# Patient Record
Sex: Male | Born: 2000 | Race: Black or African American | Hispanic: No | Marital: Single | Smoking: Never smoker
Health system: Southern US, Community
[De-identification: ages and names within clinical notes are randomized; demographics above are authoritative.]

## PROBLEM LIST (undated history)

## (undated) DIAGNOSIS — J45909 Unspecified asthma, uncomplicated: Secondary | ICD-10-CM

---
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
## 2019-04-02 IMAGING — DX DG CHEST 2V
2 series · 2 of 2 positions shown · non-contrast
Comparison: 10/10/2012

CLINICAL DATA: Cough and chest pain for several hours

EXAM:
CHEST  2 VIEW

[chest pa]
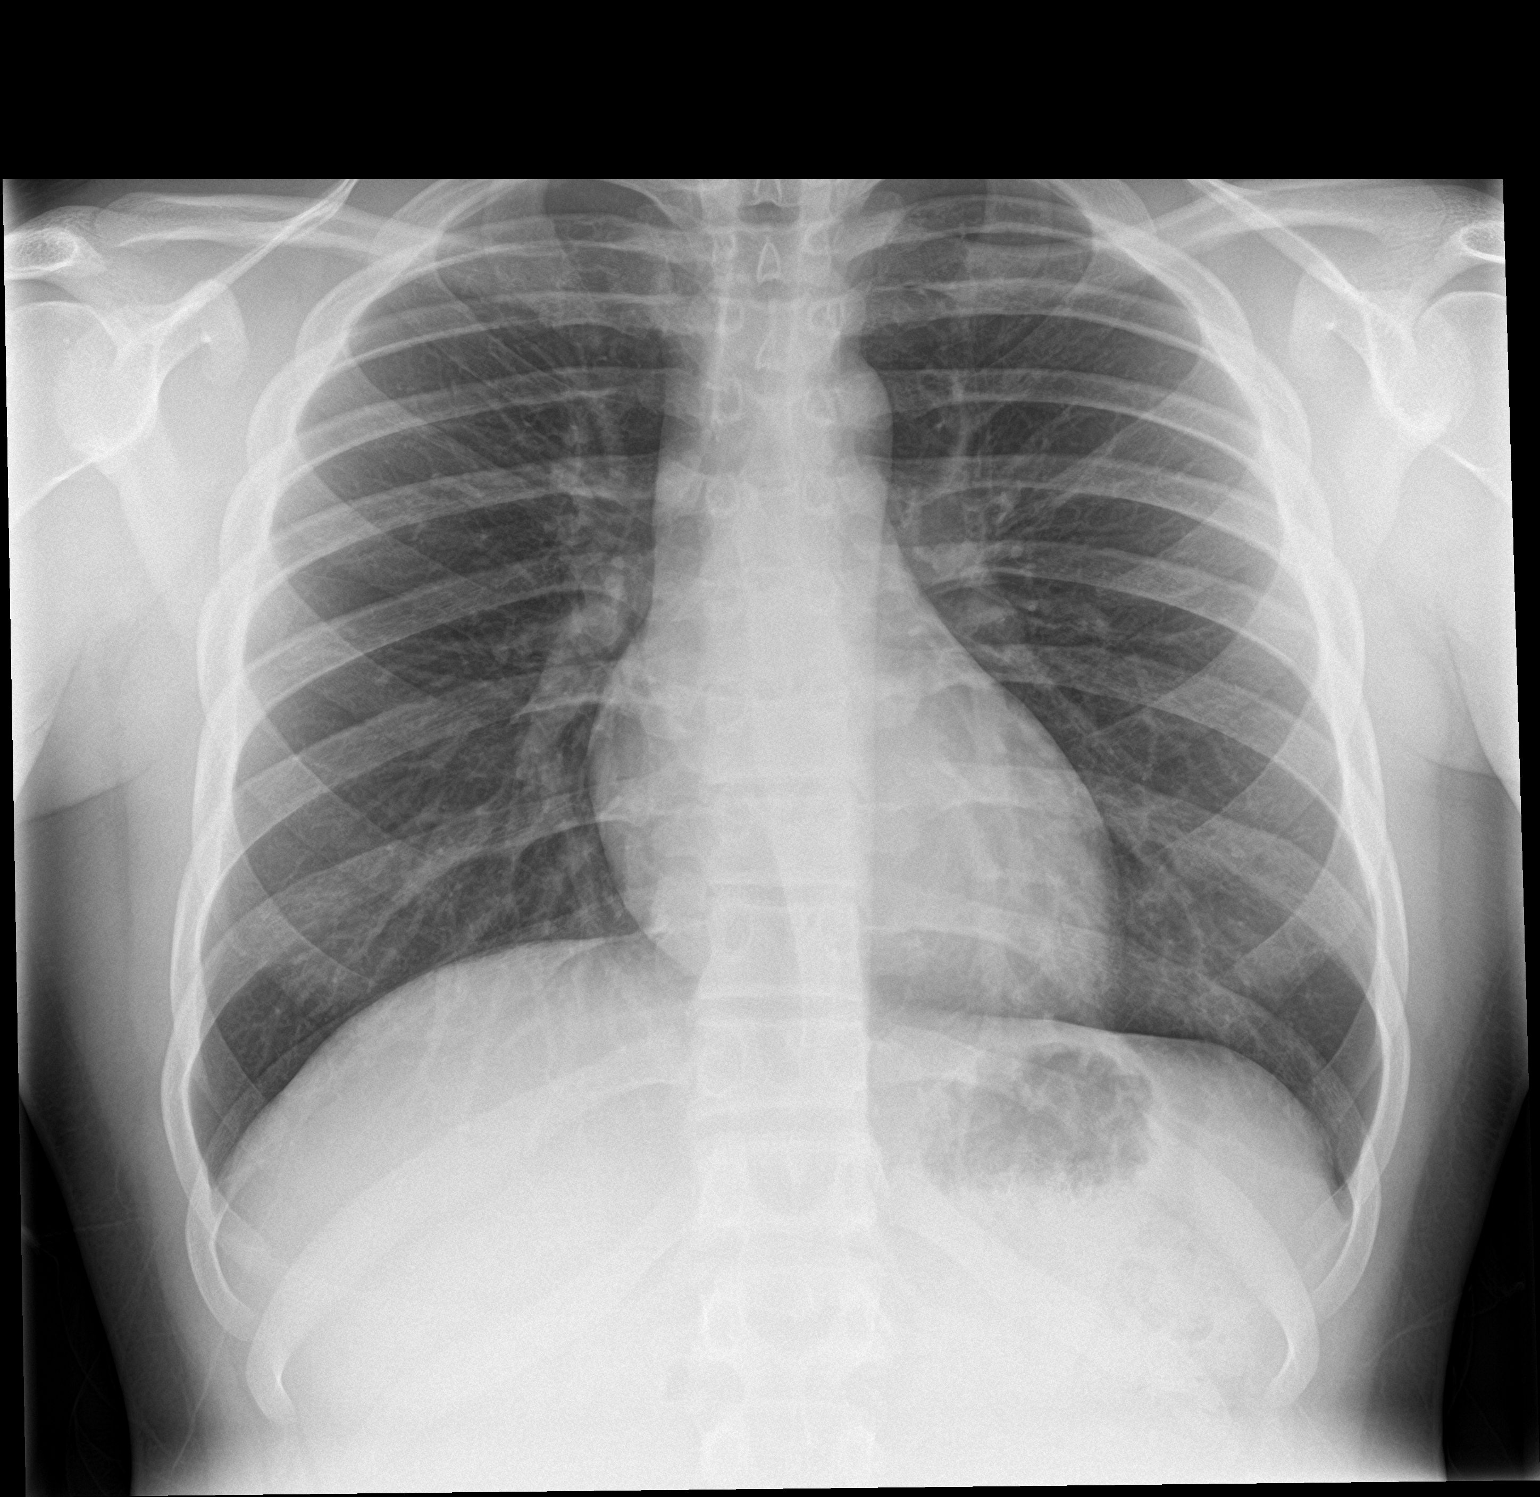

[chest lat]
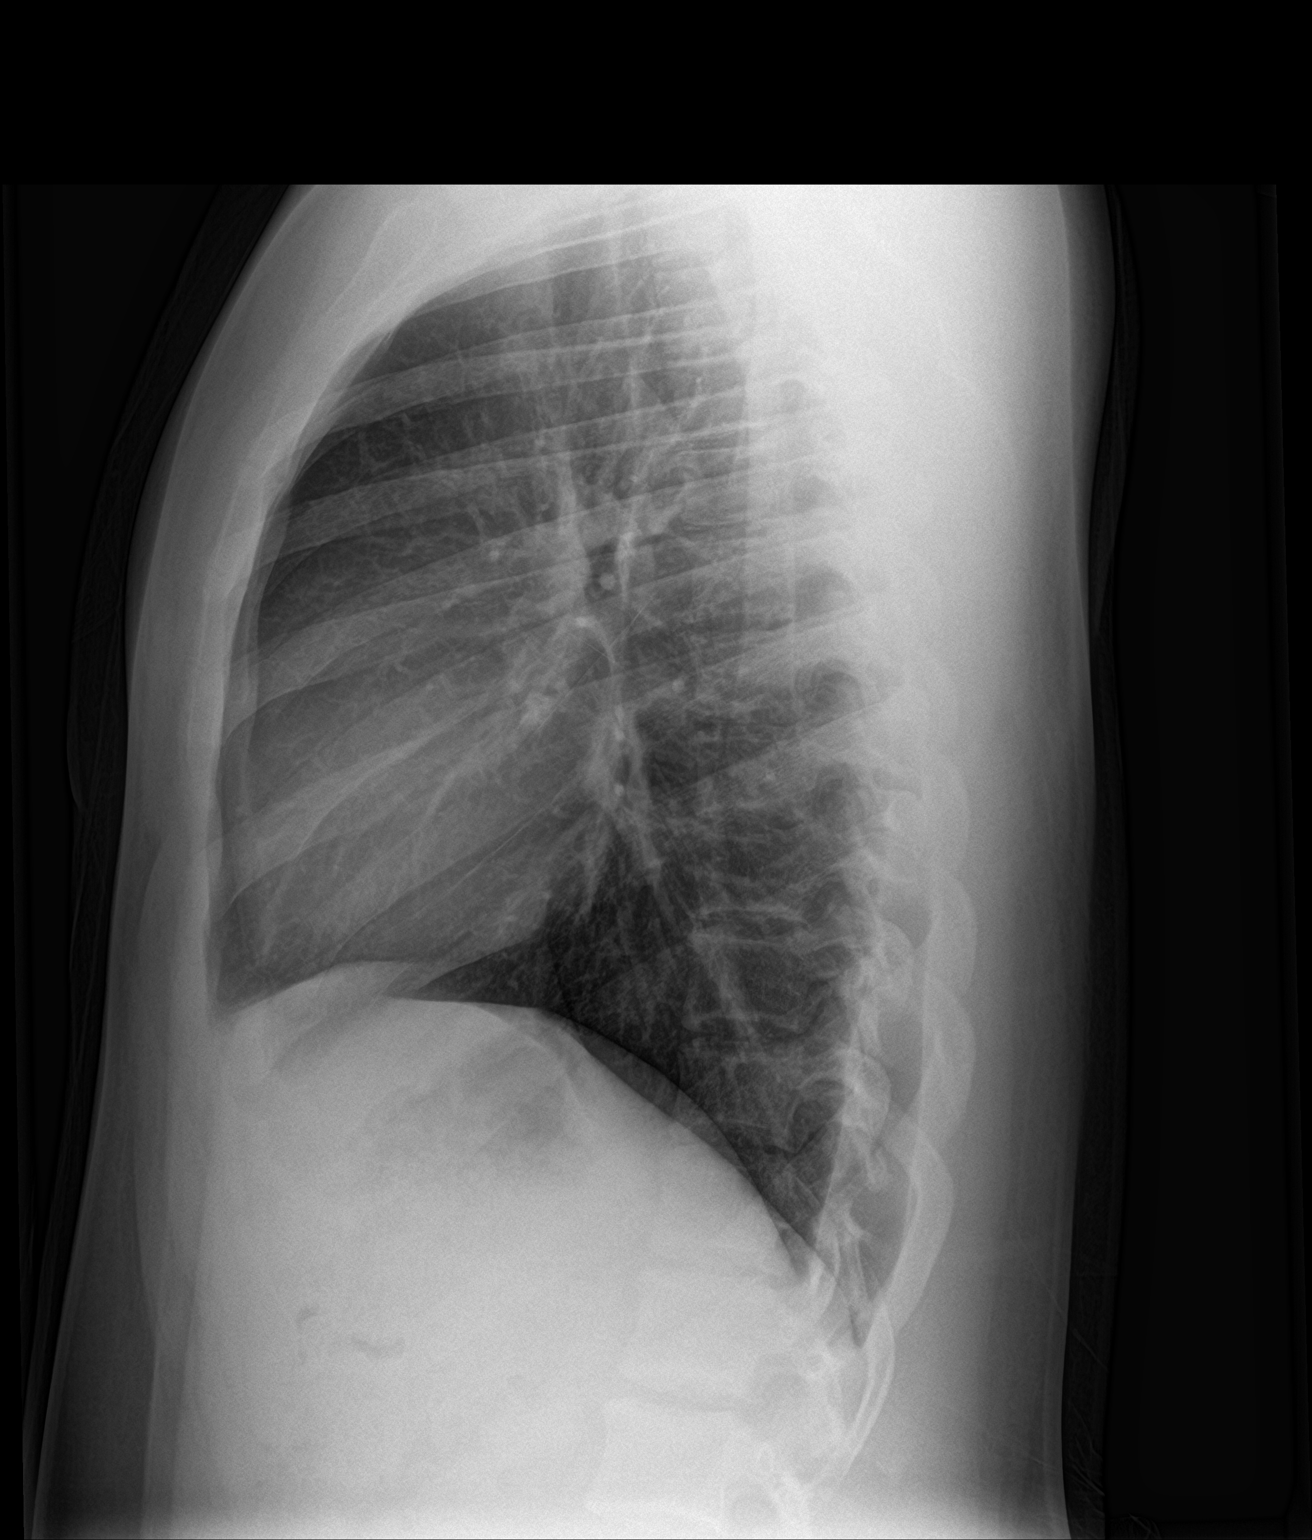

[2 of 2 positions shown; findings below may reference images not displayed]

FINDINGS: The heart size and mediastinal contours are within normal limits.
Both lungs are clear. The visualized skeletal structures are
unremarkable. Calcified granuloma is again noted in the right mid
lung
IMPRESSION: No active cardiopulmonary disease.

## 2020-01-13 ENCOUNTER — Encounter (HOSPITAL_BASED_OUTPATIENT_CLINIC_OR_DEPARTMENT_OTHER): Payer: Self-pay | Admitting: *Deleted

## 2020-01-13 ENCOUNTER — Emergency Department (HOSPITAL_BASED_OUTPATIENT_CLINIC_OR_DEPARTMENT_OTHER)
Admission: EM | Admit: 2020-01-13 | Discharge: 2020-01-13 | Disposition: A | Discharge status: Home / Self Care | Payer: Medicaid Other | Attending: Emergency Medicine | Admitting: Emergency Medicine

## 2020-01-13 ENCOUNTER — Other Ambulatory Visit: Payer: Self-pay

## 2020-01-13 DIAGNOSIS — Z5321 Procedure and treatment not carried out due to patient leaving prior to being seen by health care provider: Secondary | ICD-10-CM | POA: Insufficient documentation

## 2020-01-13 DIAGNOSIS — Z20822 Contact with and (suspected) exposure to covid-19: Secondary | ICD-10-CM | POA: Insufficient documentation

## 2020-01-13 DIAGNOSIS — R059 Cough, unspecified: Secondary | ICD-10-CM | POA: Diagnosis present

## 2020-01-13 DIAGNOSIS — J029 Acute pharyngitis, unspecified: Secondary | ICD-10-CM | POA: Insufficient documentation

## 2020-01-13 LAB — RESP PANEL BY RT-PCR (FLU A&B, COVID) ARPGX2
Influenza A by PCR: NEGATIVE
Influenza B by PCR: NEGATIVE
SARS Coronavirus 2 by RT PCR: NEGATIVE

## 2020-01-13 NOTE — ED Triage Notes (Signed)
C/o cough and sore throat x 6 days

## 2022-11-30 ENCOUNTER — Other Ambulatory Visit: Payer: Self-pay

## 2022-11-30 ENCOUNTER — Emergency Department (HOSPITAL_COMMUNITY)
Admission: EM | Admit: 2022-11-30 | Discharge: 2022-12-01 | Disposition: A | Discharge status: Home / Self Care | Payer: Medicaid Other | Attending: Emergency Medicine | Admitting: Emergency Medicine

## 2022-11-30 DIAGNOSIS — R1110 Vomiting, unspecified: Secondary | ICD-10-CM | POA: Diagnosis present

## 2022-11-30 DIAGNOSIS — J45909 Unspecified asthma, uncomplicated: Secondary | ICD-10-CM | POA: Diagnosis not present

## 2022-11-30 DIAGNOSIS — R112 Nausea with vomiting, unspecified: Secondary | ICD-10-CM | POA: Diagnosis not present

## 2022-11-30 LAB — CBC
HCT: 45 % (ref 39.0–52.0)
Hemoglobin: 14.8 g/dL (ref 13.0–17.0)
MCH: 27.6 pg (ref 26.0–34.0)
MCHC: 32.9 g/dL (ref 30.0–36.0)
MCV: 84 fL (ref 80.0–100.0)
Platelets: 193 10*3/uL (ref 150–400)
RBC: 5.36 MIL/uL (ref 4.22–5.81)
RDW: 12.7 % (ref 11.5–15.5)
WBC: 7.2 10*3/uL (ref 4.0–10.5)
nRBC: 0 % (ref 0.0–0.2)

## 2022-11-30 LAB — URINALYSIS, ROUTINE W REFLEX MICROSCOPIC
Glucose, UA: NEGATIVE mg/dL
Hgb urine dipstick: NEGATIVE
Ketones, ur: NEGATIVE mg/dL
Leukocytes,Ua: NEGATIVE
Nitrite: NEGATIVE
Protein, ur: NEGATIVE mg/dL
Specific Gravity, Urine: 1.025 (ref 1.005–1.030)
pH: 7 (ref 5.0–8.0)

## 2022-11-30 NOTE — ED Triage Notes (Signed)
Patient reports multiple emesis this afternoon , denies fever or diarrhea , no abdominal pain .

## 2022-12-01 LAB — COMPREHENSIVE METABOLIC PANEL
ALT: 19 U/L (ref 0–44)
AST: 20 U/L (ref 15–41)
Albumin: 4 g/dL (ref 3.5–5.0)
Alkaline Phosphatase: 58 U/L (ref 38–126)
Anion gap: 10 (ref 5–15)
BUN: 7 mg/dL (ref 6–20)
CO2: 31 mmol/L (ref 22–32)
Calcium: 9.3 mg/dL (ref 8.9–10.3)
Chloride: 101 mmol/L (ref 98–111)
Creatinine, Ser: 0.95 mg/dL (ref 0.61–1.24)
GFR, Estimated: >60 mL/min (ref >60)
Glucose, Bld: 90 mg/dL (ref 70–99)
Potassium: 3.2 mmol/L — ABNORMAL LOW (ref 3.5–5.1)
Sodium: 142 mmol/L (ref 135–145)
Total Bilirubin: 0.9 mg/dL (ref 0.3–1.2)
Total Protein: 7.8 g/dL (ref 6.5–8.1)

## 2022-12-01 LAB — LIPASE, BLOOD: Lipase: 32 U/L (ref 11–51)

## 2022-12-01 MED ORDER — ONDANSETRON HCL 4 MG PO TABS: 4.0000 mg | ORAL_TABLET | Freq: Three times a day (TID) | ORAL | 0 refills | Status: AC | PRN

## 2022-12-01 MED ORDER — ONDANSETRON 4 MG PO TBDP
8.0000 mg | ORAL_TABLET | Freq: Once | ORAL | Status: AC
  Administered 2022-12-01: 8 mg via ORAL
  Filled 2022-12-01: qty 2

## 2022-12-01 NOTE — ED Notes (Addendum)
Patient verbalizes understanding of discharge instructions. Opportunity for questioning and answers were provided. Pt discharged from ED to home via POV. Beverage provided at discharge.
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
## 2022-12-01 NOTE — Discharge Instructions (Addendum)
Your workup tonight was reassuring.  Please take the prescribed medication for your nausea.  If you develop any life-threatening symptoms please return to the emergency department.
